# Patient Record
Sex: Female | Born: 1965 | Race: Black or African American | Hispanic: No | Marital: Married | Smoking: Never smoker
Health system: Southern US, Community
[De-identification: ages and names within clinical notes are randomized; demographics above are authoritative.]

## PROBLEM LIST (undated history)

## (undated) DIAGNOSIS — C539 Malignant neoplasm of cervix uteri, unspecified: Secondary | ICD-10-CM

## (undated) DIAGNOSIS — E10319 Type 1 diabetes mellitus with unspecified diabetic retinopathy without macular edema: Secondary | ICD-10-CM

## (undated) DIAGNOSIS — I10 Essential (primary) hypertension: Secondary | ICD-10-CM

## (undated) DIAGNOSIS — Z992 Dependence on renal dialysis: Secondary | ICD-10-CM

## (undated) DIAGNOSIS — N289 Disorder of kidney and ureter, unspecified: Secondary | ICD-10-CM

## (undated) DIAGNOSIS — D649 Anemia, unspecified: Secondary | ICD-10-CM

## (undated) HISTORY — DX: Anemia, unspecified: D64.9

## (undated) HISTORY — PX: TUBAL LIGATION: SHX77

## (undated) HISTORY — PX: ABDOMINAL HYSTERECTOMY: SHX81

## (undated) HISTORY — DX: Type 1 diabetes mellitus with unspecified diabetic retinopathy without macular edema: E10.319

---

## 2005-03-05 ENCOUNTER — Other Ambulatory Visit: Payer: Self-pay

## 2006-03-29 ENCOUNTER — Other Ambulatory Visit: Payer: Self-pay

## 2006-11-28 ENCOUNTER — Other Ambulatory Visit: Payer: Self-pay

## 2007-12-06 ENCOUNTER — Other Ambulatory Visit: Payer: Self-pay

## 2007-12-07 ENCOUNTER — Ambulatory Visit: Payer: Self-pay | Admitting: Cardiology

## 2009-09-29 ENCOUNTER — Other Ambulatory Visit: Payer: Self-pay

## 2011-09-05 ENCOUNTER — Other Ambulatory Visit: Payer: Self-pay | Admitting: Emergency Medicine

## 2012-03-17 ENCOUNTER — Other Ambulatory Visit: Payer: Self-pay | Admitting: Emergency Medicine

## 2012-05-04 ENCOUNTER — Encounter (HOSPITAL_COMMUNITY): Payer: Self-pay | Admitting: Emergency Medicine

## 2012-05-04 ENCOUNTER — Emergency Department (HOSPITAL_COMMUNITY)
Admission: EM | Admit: 2012-05-04 | Discharge: 2012-05-04 | Discharge status: Against Medical Advice | Payer: Self-pay | Attending: Emergency Medicine | Admitting: Emergency Medicine

## 2012-05-04 DIAGNOSIS — E162 Hypoglycemia, unspecified: Secondary | ICD-10-CM | POA: Insufficient documentation

## 2012-05-04 HISTORY — DX: Malignant neoplasm of cervix uteri, unspecified: C53.9

## 2012-05-04 HISTORY — DX: Essential (primary) hypertension: I10

## 2012-05-04 HISTORY — DX: Disorder of kidney and ureter, unspecified: N28.9

## 2012-05-04 HISTORY — DX: Dependence on renal dialysis: Z99.2

## 2012-05-04 LAB — GLUCOSE, CAPILLARY: Glucose-Capillary: 160 mg/dL — ABNORMAL HIGH (ref 70–99)

## 2012-05-04 NOTE — ED Notes (Signed)
Pt and family up to desk to inquire about wait times.  Apologized for delays and encouraged pt to stay.

## 2012-05-04 NOTE — ED Notes (Signed)
Pt reports confusion at home.  States her kids gave her Hawaiian Punch to drink and then she felt better.  Now only c/o bilateral lower extremity edema x 1 week.  States she believes her blood sugar was low. Pt alert and oriented.

## 2012-05-04 NOTE — ED Notes (Signed)
Pt seen leaving ED with family walking with slow steady gait.  Pt in NAD, respirations even and unlabored.

## 2012-09-05 ENCOUNTER — Ambulatory Visit (INDEPENDENT_AMBULATORY_CARE_PROVIDER_SITE_OTHER): Payer: 59 | Admitting: Family Medicine

## 2012-09-05 ENCOUNTER — Encounter: Payer: Self-pay | Admitting: Family Medicine

## 2012-09-05 VITALS — BP 128/72 | HR 88 | Temp 98.1°F | Resp 16 | Ht 60.0 in | Wt 112.4 lb

## 2012-09-05 DIAGNOSIS — K089 Disorder of teeth and supporting structures, unspecified: Secondary | ICD-10-CM

## 2012-09-05 DIAGNOSIS — E109 Type 1 diabetes mellitus without complications: Secondary | ICD-10-CM

## 2012-09-05 DIAGNOSIS — N186 End stage renal disease: Secondary | ICD-10-CM

## 2012-09-05 DIAGNOSIS — E7800 Pure hypercholesterolemia, unspecified: Secondary | ICD-10-CM

## 2012-09-05 DIAGNOSIS — I10 Essential (primary) hypertension: Secondary | ICD-10-CM

## 2012-09-05 LAB — HEMOGLOBIN A1C
Hgb A1c MFr Bld: 8.4 % — ABNORMAL HIGH (ref <5.7)
Mean Plasma Glucose: 194 mg/dL — ABNORMAL HIGH (ref <117)

## 2012-09-05 LAB — COMPREHENSIVE METABOLIC PANEL
ALT: 25 U/L (ref 0–35)
Albumin: 4.3 g/dL (ref 3.5–5.2)
Alkaline Phosphatase: 102 U/L (ref 39–117)
Glucose, Bld: 114 mg/dL — ABNORMAL HIGH (ref 70–99)
Potassium: 4.1 mEq/L (ref 3.5–5.3)
Sodium: 139 mEq/L (ref 135–145)
Total Protein: 7.7 g/dL (ref 6.0–8.3)

## 2012-09-05 LAB — LIPID PANEL
LDL Cholesterol: 156 mg/dL — ABNORMAL HIGH (ref 0–99)
Triglycerides: 84 mg/dL (ref <150)

## 2012-09-05 NOTE — Patient Instructions (Addendum)
1. Pure hypercholesterolemia  CBC with Differential, CK, Comprehensive metabolic panel, Lipid panel  2. Essential hypertension, benign    3. Dental disease    4. End stage renal disease    5. Type I (juvenile type) diabetes mellitus without mention of complication, not stated as uncontrolled

## 2012-09-05 NOTE — Progress Notes (Signed)
[address]   [place], [place]  [address]   [phone]  Subjective:    Patient ID: Susan Briggs, female    DOB: 1965-11-19, 46 y.o.   MRN: [id number]  HPIThis 46 y.o. female presents to establish care and for six month follow-up:  1. ESRD:  Started on HD 05/2012.  Out of work for three months; started full time work in 07/2012.  Attending MWF in [place]; followed by nephrology/[facility].  Sees at [facility].  2.  DMI:  No recent follow-up with Dr. Hyacinth Meeker; insulin stopped in hospital; checking sugars at home; checking five times per week; running 123 before lunch.    3.  Anxiety:  Resolved since switching to HD.  No further issues.  4.  HTN:  Taking Amlodipine, Metoprolol.  No swelling.  Some low BP; drops on HD machine.  No medications on HD days.    5.  Anemia:  Receiving iron infusions at HD.  6.  Hyperlipidemia:  Compliance with Lipitor; ate steak n cheese biscuit this morning.  7.  Dental exam:  Needs clearance for teeth extraction; sent consent form for clearance.  +dental disease.  Extraction of teeth and give partial plate.    8. Flu vaccine in September at [facility] Center.     Review of Systems  Constitutional: Negative for fever, chills, diaphoresis and fatigue.  Eyes: Positive for visual disturbance.  Respiratory: Negative for shortness of breath, wheezing and stridor.   Cardiovascular: Negative for chest pain, palpitations and leg swelling.  Gastrointestinal: Negative for nausea, vomiting, diarrhea and constipation.  Endocrine: Negative for cold intolerance, heat intolerance, polydipsia, polyphagia and polyuria.  Skin: Negative for rash and wound.  Neurological: Negative for dizziness, tremors, syncope, facial asymmetry, speech difficulty, weakness, light-headedness, numbness and headaches.  Psychiatric/Behavioral: Negative for suicidal ideas, sleep disturbance, self-injury, dysphoric mood and decreased concentration. The patient is not nervous/anxious.          Past Medical History  Diagnosis Date  . Diabetes mellitus   . Hypertension   . Renal disorder   . Dialysis patient   . Cervical cancer   . Anemia     Past Surgical History  Procedure Laterality Date  . Abdominal hysterectomy    . Tubal ligation      Prior to Admission medications   Medication Sig Start Date End Date Taking? Authorizing Provider  amLODipine (NORVASC) 5 MG tablet Take 5 mg by mouth daily.   Yes Historical Provider, MD  atorvastatin (LIPITOR) 40 MG tablet Take 40 mg by mouth daily.   Yes Historical Provider, MD  cinacalcet (SENSIPAR) 90 MG tablet Take 90 mg by mouth daily.   Yes Historical Provider, MD  insulin aspart (NOVOLOG) 100 UNIT/ML injection Inject into the skin 3 (three) times daily before meals.   Yes Historical Provider, MD  metoprolol (LOPRESSOR) 100 MG tablet Take 100 mg by mouth 2 (two) times daily.   Yes Historical Provider, MD    Allergies  Allergen Reactions  . Vicodin (Hydrocodone-Acetaminophen)     swelling    History   Social History  . Marital Status: Married    Spouse Name: N/A    Number of Children: N/A  . Years of Education: N/A   Occupational History  . Not on file.   Social History Main Topics  . Smoking status: Never Smoker   . Smokeless tobacco: Not on file  . Alcohol Use: No  . Drug Use: No  . Sexually Active:  Other Topics Concern  . Not on file   Social History Narrative  . No narrative on file    Family History  Problem Relation Age of Onset  . Diabetes Mother   . Kidney disease Mother   . Diabetes Maternal Grandfather   . Kidney disease Maternal Grandfather     Objective:   Physical Exam  Nursing note and vitals reviewed. Constitutional: She is oriented to person, place, and time. She appears well-developed and well-nourished. No distress.  HENT:  Head: Normocephalic and atraumatic.  Mouth/Throat: Oropharynx is clear and moist.  Eyes: Conjunctivae and EOM are normal. Pupils are equal,  round, and reactive to light.  Neck: Normal range of motion. Neck supple. No JVD present. No thyromegaly present.  Cardiovascular: Normal rate and regular rhythm.   Murmur heard. Pulmonary/Chest: Effort normal and breath sounds normal. She has no wheezes. She has no rales.  Abdominal: Soft. Bowel sounds are normal. There is no tenderness. There is no rebound and no guarding.  Lymphadenopathy:    She has no cervical adenopathy.  Neurological: She is alert and oriented to person, place, and time. No cranial nerve deficit. She exhibits normal muscle tone. Coordination normal.  Skin: She is not diaphoretic.  Psychiatric: She has a normal mood and affect. Her behavior is normal. Judgment and thought content normal.       Assessment & Plan:  Pure hypercholesterolemia - Plan: CBC with Differential, CK, Comprehensive metabolic panel, Lipid panel  Essential hypertension, benign  Dental disease  End stage renal disease  Type I (juvenile type) diabetes mellitus without mention of complication, not stated as uncontrolled - Plan: Hemoglobin A1c   1.  Hyperlipidemia:  Uncontrolled; obtain labs; continue current medications. 2.  HTN: improved with initiation of HD.  Clearance for dental procedure. 3.  Dental disease: New.  Clearance for dental procedure. 4.  ESRD: worsening; failed peritoneal dialysis and now symptomatically much improved with HD three days per week.   5.  DM type I: stable; no longer receiving insulin; obtain labs.  No recent follow-up with endocrinology.  Meds ordered this encounter  Medications  . metoprolol (LOPRESSOR) 100 MG tablet    Sig: Take 100 mg by mouth 2 (two) times daily.  Marland Kitchen amLODipine (NORVASC) 5 MG tablet    Sig: Take 5 mg by mouth daily.  Marland Kitchen atorvastatin (LIPITOR) 40 MG tablet    Sig: Take 40 mg by mouth daily.  . cinacalcet (SENSIPAR) 90 MG tablet    Sig: Take 90 mg by mouth daily.  . insulin aspart (NOVOLOG) 100 UNIT/ML injection    Sig: Inject into the  skin 3 (three) times daily before meals.

## 2012-09-06 LAB — CBC WITH DIFFERENTIAL/PLATELET
Basophils Absolute: 0 10*3/uL (ref 0.0–0.1)
HCT: 36.5 % (ref 36.0–46.0)
Hemoglobin: 11.2 g/dL — ABNORMAL LOW (ref 12.0–15.0)
Lymphocytes Relative: 35 % (ref 12–46)
Monocytes Absolute: 0.5 10*3/uL (ref 0.1–1.0)
Monocytes Relative: 7 % (ref 3–12)
Neutro Abs: 3.7 10*3/uL (ref 1.7–7.7)
WBC: 7 10*3/uL (ref 4.0–10.5)

## 2012-09-14 ENCOUNTER — Encounter: Payer: Self-pay | Admitting: Family Medicine

## 2012-11-26 NOTE — Progress Notes (Signed)
Reviewed and agree.

## 2012-12-28 ENCOUNTER — Telehealth: Payer: Self-pay | Admitting: Radiology

## 2012-12-28 NOTE — Telephone Encounter (Signed)
Patient needs hospital follow up [phone] is the contact number given from Dr Katrinka Blazing old office [facility]. I have called patient at the number provided and left message for patient to call me back so I can provide her with Dr Lonn Georgia hours so she can follow up here. To you FYI

## 2013-01-05 ENCOUNTER — Ambulatory Visit (INDEPENDENT_AMBULATORY_CARE_PROVIDER_SITE_OTHER): Payer: 59 | Admitting: Family Medicine

## 2013-01-05 VITALS — BP 160/84 | HR 78 | Temp 98.2°F | Resp 16 | Ht 59.75 in | Wt 110.0 lb

## 2013-01-05 DIAGNOSIS — N186 End stage renal disease: Secondary | ICD-10-CM

## 2013-01-05 DIAGNOSIS — I10 Essential (primary) hypertension: Secondary | ICD-10-CM

## 2013-01-05 DIAGNOSIS — E108 Type 1 diabetes mellitus with unspecified complications: Secondary | ICD-10-CM

## 2013-01-05 DIAGNOSIS — R1012 Left upper quadrant pain: Secondary | ICD-10-CM

## 2013-01-05 NOTE — Patient Instructions (Addendum)
Abdominal pain, left upper quadrant  Essential hypertension, benign    1. INCREASE AMLODIPINE TO 10MG  DAILY FOR BLOOD PRESSURE. 2.  START PANTOPRAZOLE 40MG  ONCE DAILY FOR STOMACH INFLAMMATION; TAKE FOR ONE MONTH ONLY.
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
## 2013-01-06 DIAGNOSIS — N186 End stage renal disease: Secondary | ICD-10-CM | POA: Insufficient documentation

## 2013-01-06 DIAGNOSIS — I10 Essential (primary) hypertension: Secondary | ICD-10-CM | POA: Insufficient documentation

## 2013-01-06 DIAGNOSIS — E1065 Type 1 diabetes mellitus with hyperglycemia: Secondary | ICD-10-CM | POA: Insufficient documentation

## 2013-01-06 DIAGNOSIS — R1012 Left upper quadrant pain: Secondary | ICD-10-CM | POA: Insufficient documentation

## 2013-01-06 MED ORDER — PANTOPRAZOLE SODIUM 40 MG PO TBEC: 40.0000 mg | DELAYED_RELEASE_TABLET | Freq: Every day | ORAL | Status: DC

## 2013-01-06 MED ORDER — AMLODIPINE BESYLATE 10 MG PO TABS: 10.0000 mg | ORAL_TABLET | Freq: Every day | ORAL | Status: DC

## 2013-01-06 NOTE — Assessment & Plan Note (Signed)
Uncontrolled; increase Amlodipine to 10mg  daily.  Do not fill Hydralazine 50mg  and Losartan 50mg .

## 2013-01-06 NOTE — Assessment & Plan Note (Signed)
Stable; tolerating HD with euvolemia.  Nephrologist has not adjusted antihypertensive medications recently so will adjust at this time.

## 2013-01-06 NOTE — Assessment & Plan Note (Signed)
Controlled at this time with PRN insulin.  Stable sugars during recent admission.

## 2013-01-06 NOTE — Assessment & Plan Note (Signed)
New. Admission notes reviewed in detail during visit.  No association to eating.  Now occurring four times daily with each episode lasting one minute.  Recommend starting Protonix 40mg  daily for one month.  If persists or worsens, will warrant GI referral for EGD.

## 2013-03-03 ENCOUNTER — Other Ambulatory Visit: Payer: Self-pay | Admitting: Physician Assistant

## 2013-03-03 ENCOUNTER — Ambulatory Visit (INDEPENDENT_AMBULATORY_CARE_PROVIDER_SITE_OTHER): Payer: 59 | Admitting: Emergency Medicine

## 2013-03-03 DIAGNOSIS — L299 Pruritus, unspecified: Secondary | ICD-10-CM

## 2013-03-03 DIAGNOSIS — R21 Rash and other nonspecific skin eruption: Secondary | ICD-10-CM

## 2013-03-03 MED ORDER — HYDROXYZINE HCL 10 MG PO TABS: 10.0000 mg | ORAL_TABLET | Freq: Two times a day (BID) | ORAL | Status: AC

## 2013-03-03 MED ORDER — CAMPHOR-MENTHOL 0.5-0.5 % EX LOTN: TOPICAL_LOTION | CUTANEOUS | Status: AC | PRN

## 2013-03-03 NOTE — Progress Notes (Signed)
   [address] [place] [address]   Phone [phone]  Subjective:    Patient ID: Susan Briggs, female    DOB: Mar 04, 1966, 47 y.o.   MRN: [id number]  HPI  Itching for 5 months - not getting better.  She has tried multiple OTC treatments for scabies and none are working but she is positive she has scabies.  No one at home is itching.  She has not told her renal Drs that she is itching.  She had dialysis today.  She has a rash on her back mainly but she is itching everywhere - she scalp is terrible and she has been putting on in it but it is not helping.  OTC - green bug, Clorox, tea tree oil - not helping People in her home are now itching.     Review of Systems  Skin: Positive for rash.       Objective:   Physical Exam  Constitutional: She appears well-developed and well-nourished.  Pt scratching the entire time.  HENT:  Head: Normocephalic and atraumatic.  Right Ear: External ear normal.  Eyes: No scleral icterus.  Skin: Skin is warm and dry.  Hyperpigmented area mainly on back - not in a serpentine pattern and not in her web spaces.  Psychiatric: She has a normal mood and affect. Her behavior is normal. Judgment and thought content normal.   Biopsy - consent obtained.  Local anesthesia with 1% lido with epi - 2mm punch for biopsy of a lesion to r/o scabies.       Assessment & Plan:  Itching -most likely related to her renal disease - low chance of scabies due to distribution and no one at home with problems.  Plan: Comprehensive metabolic panel, Dermatology pathology, camphor-menthol (SARNA) lotion, hydrOXYzine (ATARAX/VISTARIL) 10 MG tablet  Rash and nonspecific skin eruption - Plan: Dermatology pathology  Renal disease - on dialysis - D/w Dr Allena Katz plan for itching.  She will f/u with nephrologist on Monday at her next dialysis treatment.  Pt seen and examined with Dr Cleta Alberts.  Benny Lennert PA-C 03/03/2013 7:36 PM

## 2013-03-04 LAB — COMPREHENSIVE METABOLIC PANEL
ALT: 49 U/L — ABNORMAL HIGH (ref 0–35)
AST: 36 U/L (ref 0–37)
Albumin: 4.4 g/dL (ref 3.5–5.2)
Alkaline Phosphatase: 273 U/L — ABNORMAL HIGH (ref 39–117)
Glucose, Bld: 154 mg/dL — ABNORMAL HIGH (ref 70–99)
Potassium: 3.7 mEq/L (ref 3.5–5.3)
Sodium: 137 mEq/L (ref 135–145)
Total Protein: 7.7 g/dL (ref 6.0–8.3)

## 2013-03-05 NOTE — Addendum Note (Signed)
Addended by: Morrell Riddle on: 03/05/2013 11:36 AM   Modules accepted: Orders

## 2013-03-05 NOTE — Addendum Note (Signed)
Addended by: Johnnette Litter on: 03/05/2013 11:46 AM   Modules accepted: Orders

## 2013-03-10 ENCOUNTER — Telehealth: Payer: Self-pay

## 2013-03-10 NOTE — Telephone Encounter (Signed)
I do not see results for this, have you gotten on paper?

## 2013-03-10 NOTE — Telephone Encounter (Signed)
Patient is calling to get results of biopsy. [phone]

## 2013-03-13 LAB — OTHER SOLSTAS TEST

## 2013-03-13 MED ORDER — DOXYCYCLINE HYCLATE 100 MG PO TABS: 100.0000 mg | ORAL_TABLET | Freq: Two times a day (BID) | ORAL | Status: DC

## 2013-03-13 NOTE — Addendum Note (Signed)
Addended by: Morrell Riddle on: 03/13/2013 04:03 PM   Modules accepted: Orders

## 2013-03-13 NOTE — Telephone Encounter (Signed)
Yes I got on paper - she has folliculitis and I have sent a Rx to the pharmacy.  She also has a result note in the lab.

## 2013-03-14 MED ORDER — DOXYCYCLINE HYCLATE 100 MG PO TABS: 100.0000 mg | ORAL_TABLET | Freq: Two times a day (BID) | ORAL | Status: DC

## 2013-03-14 NOTE — Telephone Encounter (Signed)
I can not see this result. Will you help me view this?

## 2013-03-14 NOTE — Telephone Encounter (Signed)
Patient is waiting on call about biopsy results. She also wants to know which pharmacy her RX was sent to. Please call at [phone]

## 2013-03-14 NOTE — Telephone Encounter (Signed)
Called her and she wants this sent in to [facility]. This is done.

## 2013-04-19 ENCOUNTER — Other Ambulatory Visit: Payer: Self-pay | Admitting: Emergency Medicine

## 2013-07-17 ENCOUNTER — Encounter: Payer: Self-pay | Admitting: Pulmonary Disease

## 2013-07-17 ENCOUNTER — Ambulatory Visit (INDEPENDENT_AMBULATORY_CARE_PROVIDER_SITE_OTHER): Payer: 59 | Admitting: Pulmonary Disease

## 2013-07-17 ENCOUNTER — Ambulatory Visit (INDEPENDENT_AMBULATORY_CARE_PROVIDER_SITE_OTHER)
Admission: RE | Admit: 2013-07-17 | Discharge: 2013-07-17 | Disposition: A | Discharge status: Home / Self Care | Payer: 59 | Source: Ambulatory Visit | Attending: Pulmonary Disease | Admitting: Pulmonary Disease

## 2013-07-17 VITALS — BP 170/80 | HR 85 | Ht 59.0 in | Wt 113.0 lb

## 2013-07-17 DIAGNOSIS — R05 Cough: Secondary | ICD-10-CM

## 2013-07-17 NOTE — Assessment & Plan Note (Addendum)
I think that the most likely etiology for Susan Briggs's cough is acid reflux and cyclical cough where ongoing cough causes more laryngeal inflammation and cough.  She does not have clear evidence of obstructive lung disease on her spirometry.  At this point I want to get a chest x-ray and focus our care for her cough on cycylical cough and GERD.  Plan: -start OTC prilosec -GERD lifestyle measures reviewed -tramadol, voice rest, active cough suppression -f/u in 3-4 weeks

## 2013-07-17 NOTE — Progress Notes (Signed)
Subjective:    Patient ID: Susan Briggs, female    DOB: 12/26/1965, 47 y.o.   MRN: [id number]  HPI  This is a very pleasant 47 year old female with end-stage renal disease and diabetes who comes to our clinic today for evaluation of a cough. She's had a cough for 5 months now. She does not believe that it started with a fever or symptoms of an upper respiratory infection. She does not feel cold symptoms in that she does not have much in the way of postnasal drip or sinus congestion. She denies any body aches. She states that the cough was persistent throughout the day. Sometimes it is worse after eating spicy foods. It is associated with a sensation of tickling in her throat. She feels that there is mucus in her throat that she cannot get rid of. She does not have sputum production. She does not feel shortness of breath except when the coughing paroxysms are particularly lengthy. She states that she tried Tussionex for the cough and it helps some but she was unable to take much of it because it made her very sleepy. She does not take anything for acid reflux. She does not feel much in the way of heartburn. She states that she has not had a cold this year and denies any fevers or chills. She has never smoked cigarettes. She moved recently, approximately one month ago, but her symptoms started before this and were not changed by the move. She does not have any animals in the house.   Past Medical History  Diagnosis Date  . Diabetes mellitus   . Hypertension   . Renal disorder   . Dialysis patient   . Cervical cancer   . Anemia      Family History  Problem Relation Age of Onset  . Diabetes Mother   . Kidney disease Mother   . Diabetes Maternal Grandfather   . Kidney disease Maternal Grandfather      History   Social History  . Marital Status: Married    Spouse Name: N/A    Number of Children: N/A  . Years of Education: N/A   Occupational History  . Not on file.   Social History  Main Topics  . Smoking status: Never Smoker   . Smokeless tobacco: Not on file  . Alcohol Use: No  . Drug Use: No  . Sexual Activity:    Other Topics Concern  . Not on file   Social History Narrative  . No narrative on file     Allergies  Allergen Reactions  . Vicodin [Hydrocodone-Acetaminophen]     swelling     Outpatient Prescriptions Prior to Visit  Medication Sig Dispense Refill  . amLODipine (NORVASC) 10 MG tablet Take 10 mg by mouth daily.      Marland Kitchen atorvastatin (LIPITOR) 40 MG tablet Take 40 mg by mouth daily.      . camphor-menthol (SARNA) lotion Apply topically as needed for itching.  222 mL  2  . cinacalcet (SENSIPAR) 90 MG tablet Take 90 mg by mouth daily.      . hydrOXYzine (ATARAX/VISTARIL) 10 MG tablet Take 1 tablet (10 mg total) by mouth 2 (two) times daily.  60 tablet  0  . insulin aspart (NOVOLOG) 100 UNIT/ML injection Inject into the skin 3 (three) times daily before meals.      . metoprolol (LOPRESSOR) 100 MG tablet Take 100 mg by mouth 2 (two) times daily.      Marland Kitchen doxycycline (VIBRA-TABS)  100 MG tablet Take 1 tablet (100 mg total) by mouth 2 (two) times daily.  20 tablet  0   No facility-administered medications prior to visit.      Review of Systems  Constitutional: Negative for fever, chills, diaphoresis, activity change, appetite change, fatigue and unexpected weight change.  HENT: Negative for hearing loss, ear pain, nosebleeds, congestion, sore throat, facial swelling, rhinorrhea, sneezing, mouth sores, trouble swallowing, neck pain, neck stiffness, dental problem, voice change, postnasal drip, sinus pressure, tinnitus and ear discharge.   Eyes: Negative for photophobia, discharge, itching and visual disturbance.  Respiratory: Positive for cough and shortness of breath. Negative for apnea, choking, chest tightness, wheezing and stridor.   Cardiovascular: Negative for chest pain, palpitations and leg swelling.  Gastrointestinal: Negative for nausea,  vomiting, abdominal pain, constipation, blood in stool and abdominal distention.  Genitourinary: Negative for dysuria, urgency, frequency, hematuria, flank pain, decreased urine volume and difficulty urinating.  Musculoskeletal: Negative for myalgias, back pain, joint swelling, arthralgias and gait problem.  Skin: Negative for color change, pallor and rash.  Neurological: Negative for dizziness, tremors, seizures, syncope, speech difficulty, weakness, light-headedness, numbness and headaches.  Hematological: Negative for adenopathy. Does not bruise/bleed easily.  Psychiatric/Behavioral: Negative for confusion, sleep disturbance and agitation. The patient is not nervous/anxious.        Objective:   Physical Exam  Filed Vitals:   07/17/13 1349  BP: 170/80  Pulse: 85  Height: 4\' 11"  (1.499 m)  Weight: 113 lb (51.256 kg)  SpO2: 93%   room air  Gen: well appearing, frequent cough HEENT: NCAT, PERRL, EOMi, OP clear, neck supple without masses PULM: CTA B CV: RRR, systolic murmur, no JVD AB: BS+, soft, nontender, no hsm Ext: warm, no edema, no clubbing, no cyanosis Derm: no rash or skin breakdown Neuro: A&Ox4, CN II-XII intact, strength 5/5 in all 4 extremities       Assessment & Plan:   Cough I think that the most likely etiology for Ms. Rockers's cough is acid reflux and cyclical cough where ongoing cough causes more laryngeal inflammation and cough.  She does not have clear evidence of obstructive lung disease on her spirometry.  At this point I want to get a chest x-ray and focus our care for her cough on cycylical cough and GERD.  Plan: -start OTC prilosec -GERD lifestyle measures reviewed -tramadol, voice rest, active cough suppression -f/u in 3-4 weeks   Updated Medication List Outpatient Encounter Prescriptions as of 07/17/2013  Medication Sig Dispense Refill  . amLODipine (NORVASC) 10 MG tablet Take 10 mg by mouth daily.      Marland Kitchen atorvastatin (LIPITOR) 40 MG tablet  Take 40 mg by mouth daily.      . camphor-menthol (SARNA) lotion Apply topically as needed for itching.  222 mL  2  . cinacalcet (SENSIPAR) 90 MG tablet Take 90 mg by mouth daily.      . hydrOXYzine (ATARAX/VISTARIL) 10 MG tablet Take 1 tablet (10 mg total) by mouth 2 (two) times daily.  60 tablet  0  . insulin aspart (NOVOLOG) 100 UNIT/ML injection Inject into the skin 3 (three) times daily before meals.      . metoprolol (LOPRESSOR) 100 MG tablet Take 100 mg by mouth 2 (two) times daily.      . [DISCONTINUED] doxycycline (VIBRA-TABS) 100 MG tablet Take 1 tablet (100 mg total) by mouth 2 (two) times daily.  20 tablet  0   No facility-administered encounter medications on file as of 07/17/2013.

## 2013-07-17 NOTE — Patient Instructions (Addendum)
We will call you with the results of the Chest X-ray  Use the tramadol 50mg  twice a day as needed to help suppress the cough.  I want you to pick three days when you don't talk, sing, cough, or laugh.  During this time try to suppress your cough every day. Use warm beverages and candy to sooth your throat.  Buy prilosec over the counter and take it every day no matter how you feel.  Avoid chocolate, caffeine, alcohol, mint, and fatty foods.  Follow the sheet we gave you  We will see you back in one month or sooner if needed, in [facility]

## 2013-07-19 NOTE — Progress Notes (Signed)
Quick Note:  lmomtcb for pt ______ 

## 2013-07-31 ENCOUNTER — Telehealth: Payer: Self-pay | Admitting: Pulmonary Disease

## 2013-07-31 MED ORDER — TRAMADOL HCL 50 MG PO TABS: 50.0000 mg | ORAL_TABLET | Freq: Four times a day (QID) | ORAL | Status: AC | PRN

## 2013-07-31 NOTE — Telephone Encounter (Signed)
I spoke with pt. Her tramadol was never called in from 07/17/13 visit. [facility]. i advised pt will call this in for her. [facility] and gave [facility]. Nothing further needed

## 2013-08-08 ENCOUNTER — Telehealth: Payer: Self-pay | Admitting: Pulmonary Disease

## 2013-08-08 NOTE — Telephone Encounter (Signed)
I spoke with pt. She reports she was RX'd tramadol. She was told by the pharmacy this was to treat pain. I advised pt we also use it to treat cough. She wanted to make sure before she took this. Nothing further needed

## 2013-08-15 ENCOUNTER — Ambulatory Visit: Payer: 59 | Admitting: Pulmonary Disease

## 2013-08-15 NOTE — Progress Notes (Signed)
No show

## 2013-08-25 ENCOUNTER — Telehealth: Payer: Self-pay

## 2013-08-25 NOTE — Telephone Encounter (Signed)
Received form for medical necessity of insulin pump and have placed it in Dr Michaelle Copas box for review.

## 2013-08-27 ENCOUNTER — Other Ambulatory Visit: Payer: Self-pay | Admitting: Emergency Medicine

## 2013-08-28 ENCOUNTER — Other Ambulatory Visit: Payer: Self-pay | Admitting: Internal Medicine

## 2013-08-28 DIAGNOSIS — R079 Chest pain, unspecified: Secondary | ICD-10-CM

## 2013-08-28 NOTE — Telephone Encounter (Signed)
I do not manage patient's diabetes mellitus type I. She is followed by Karn Cassis, MD Endocrinology in [place].  Please call patient and clarify that she is still being followed by Dr. Hyacinth Meeker; also at patient's last visit, she was not using insulin pump; is she on insulin pump now?

## 2013-08-28 NOTE — Telephone Encounter (Signed)
LMOM for pt to CB.  

## 2013-08-29 NOTE — Telephone Encounter (Signed)
Pt CB and reported that she is no longer seeing Dr Hyacinth Meeker because she is no longer practicing there in [place]. She stated that her kidney doctor is trying to get her in to see another endo, but pt asked that med [employer] form be sent to Dr Katrinka Blazing. She stated that she used to use an insulin pump but has not been lately. Pt wishes to go back on a pump because her DM was much better controlled on the pump. I advised pt that she should RTC for f/up OV w/Dr Katrinka Blazing to check status of DM and discuss. Pt agreed and transferred to appt center.  I called Susan Briggs back and gave status of form and he will wait for form after appt.

## 2013-08-29 NOTE — Telephone Encounter (Signed)
MARIO FROM [facility] ON THE MEDICAL NECESSITY FORM SENT OVER ON THE 13TH . PLEASE CALL [phone] EXT [phone]

## 2013-08-31 ENCOUNTER — Ambulatory Visit (INDEPENDENT_AMBULATORY_CARE_PROVIDER_SITE_OTHER): Payer: 59 | Admitting: Family Medicine

## 2013-08-31 VITALS — BP 165/70 | HR 73 | Temp 98.1°F | Resp 16 | Ht 60.5 in | Wt 117.0 lb

## 2013-08-31 DIAGNOSIS — I10 Essential (primary) hypertension: Secondary | ICD-10-CM

## 2013-08-31 DIAGNOSIS — E1059 Type 1 diabetes mellitus with other circulatory complications: Secondary | ICD-10-CM

## 2013-08-31 DIAGNOSIS — N186 End stage renal disease: Secondary | ICD-10-CM

## 2013-08-31 LAB — GLUCOSE, POCT (MANUAL RESULT ENTRY): POC Glucose: 163 mg/dl — AB (ref 70–99)

## 2013-08-31 NOTE — Progress Notes (Signed)
[address]   [place], [place]  [address]   [phone] Subjective:    Patient ID: Susan Briggs, female    DOB: [birth year], 47 y.o.   MRN: [id number]  This chart was scribed for Ethelda Chick, MD by Blanchard Kelch, ED Scribe. The patient was seen in room 2. Patient's care was started at 8:13 PM.   HPI  Susan Briggs is a 47 y.o. female who presents to office for a follow up post hospitalization and TRANSITION INTO CARE. She states that five days ago she went into the ER because her BP went up to 270/104 and her blood sugar was 700. [place] the circus the [place] but didn't eat anything but popcorn. She denies doing anything abnormal that could have caused the abnormal readings. She had dialysis a day before going to the ER and her BP went up then to the 200. She had a mild headache that day but denies that she had any abnormal blurred vision, double vision, dizziness, numbness or tingling. They did an EKG and a CT scan while she was in the hospital. They were regularly checking her sugars and giving her units of insulin if it was high. She was kept her there for two days, increased her Losartan from 50 mg to 100 mg and told her to follow up with her PCP.  For her diabetes, she does 15 units before each meal if her glucose levels are over 200. Before the hospitalization she states that they were normal. She last checked it two weeks before going to the ER and it was around 100. Her levels since leaving the hospital have been in the 200s. Her BP since leaving has been around 163/89, which she reports is normal for her. She now checks her BP every day and her blood sugar twice a day. She denies any chest pain or shortness of breath.    She is currently on disability due to her dialysis and ESRD. She is keeping busy by helping out with her daughter's children. They all live with her currently. She does dialysis MWF in [place].   Past Medical History  Diagnosis Date  . Diabetes  mellitus   . Hypertension   . Renal disorder   . Dialysis patient   . Cervical cancer   . Anemia    Past Surgical History  Procedure Laterality Date  . Abdominal hysterectomy    . Tubal ligation     Family History  Problem Relation Age of Onset  . Diabetes Mother   . Kidney disease Mother   . Diabetes Maternal Grandfather   . Kidney disease Maternal Grandfather    Allergies  Allergen Reactions  . Vicodin [Hydrocodone-Acetaminophen]     swelling   Current Outpatient Prescriptions on File Prior to Visit  Medication Sig Dispense Refill  . amLODipine (NORVASC) 10 MG tablet Take 10 mg by mouth daily.      Marland Kitchen atorvastatin (LIPITOR) 40 MG tablet Take 40 mg by mouth daily.      . hydrOXYzine (ATARAX/VISTARIL) 10 MG tablet Take 1 tablet (10 mg total) by mouth 2 (two) times daily.  60 tablet  0  . insulin aspart (NOVOLOG) 100 UNIT/ML injection Inject into the skin 3 (three) times daily before meals.      . camphor-menthol (SARNA) lotion Apply topically as needed for itching.  222 mL  2  . cinacalcet (SENSIPAR) 90 MG tablet Take 90 mg by mouth daily.      Marland Kitchen  metoprolol (LOPRESSOR) 100 MG tablet Take 100 mg by mouth 2 (two) times daily.      . traMADol (ULTRAM) 50 MG tablet Take 1 tablet (50 mg total) by mouth every 6 (six) hours as needed.  40 tablet  0   No current facility-administered medications on file prior to visit.   History   Social History  . Marital Status: Married    Spouse Name: N/A    Number of Children: N/A  . Years of Education: N/A   Occupational History  . Not on file.   Social History Main Topics  . Smoking status: Never Smoker   . Smokeless tobacco: Not on file  . Alcohol Use: No  . Drug Use: No  . Sexual Activity:    Other Topics Concern  . Not on file   Social History Narrative  . No narrative on file     Review of Systems  Constitutional: Negative for fever.  HENT: Negative for drooling.   Eyes: Positive for visual disturbance. Negative for  discharge.  Respiratory: Negative for cough and shortness of breath.   Cardiovascular: Positive for leg swelling. Negative for chest pain and palpitations.  Gastrointestinal: Negative for vomiting and abdominal pain.  Endocrine: Negative for heat intolerance, polydipsia, polyphagia and polyuria.  Genitourinary: Negative for hematuria.  Musculoskeletal: Negative for gait problem.  Skin: Negative for rash.  Allergic/Immunologic: Negative for immunocompromised state.  Neurological: Negative for dizziness, tremors, seizures, syncope, facial asymmetry, speech difficulty, weakness, light-headedness, numbness and headaches.  Hematological: Negative for adenopathy.  Psychiatric/Behavioral: Negative for confusion.       Objective:   Physical Exam  Nursing note and vitals reviewed. Constitutional: She is oriented to person, place, and time. She appears well-developed and well-nourished. No distress.  HENT:  Head: Normocephalic and atraumatic.  Eyes: EOM are normal.  Neck: Neck supple. No tracheal deviation present.  Cardiovascular: Normal rate and regular rhythm.   Murmur heard. Harsh systolic murmur, radiates into bilateral carotids.  Pulmonary/Chest: Effort normal. No respiratory distress. She has no wheezes. She has no rhonchi. She has no rales.  Abdominal: Soft. She exhibits no distension. There is no tenderness.  Musculoskeletal: Normal range of motion. She exhibits edema.  1+ pitting edema   Neurological: She is alert and oriented to person, place, and time.  Skin: Skin is warm and dry.  Psychiatric: She has a normal mood and affect. Her behavior is normal.   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 HTN: uncontrolled and warranted admission for elevated readings; improved BP today; refill of Losartan 100mg  daily provided.  Admission records reviewed in detail during visit; asymptomatic today. 2.  DMI:  Moderately controlled at this time; continue SSI; encourage checking sugars tid.  Encourage follow-up with endocrinology. 3.  ESRD: stable; maintained on HD three days weekly; compliance with HD.  Good family support; now on disability.  Meds ordered this encounter  Medications  . losartan (COZAAR) 100 MG tablet    Sig: Take 100 mg by mouth daily.   I personally performed the services described in this documentation, which was scribed in my presence.  The recorded information has  been reviewed and is accurate.  Nilda Simmer, M.D.  [facility] [address] [place], [place]  [address] [phone] phone [phone] fax

## 2013-08-31 NOTE — Telephone Encounter (Signed)
Evaluated patient in office on 08/31/13; I do not manage insulin pumps; she is awaiting endocrinology appointment; to defer insulin pump medical necessity until endocrinology evaluates her.  Please advise Marquita Palms from [facility].

## 2013-09-01 NOTE — Telephone Encounter (Signed)
Called and [facility] for [facility] w/info from Dr Katrinka Blazing.

## 2013-09-12 ENCOUNTER — Ambulatory Visit: Payer: 59 | Admitting: Pulmonary Disease

## 2013-09-12 NOTE — Progress Notes (Signed)
No show

## 2013-10-19 ENCOUNTER — Telehealth: Payer: Self-pay

## 2013-10-19 NOTE — Telephone Encounter (Signed)
Patient of Dr Tamala Julian. Would like cream for scabies. [facility] [place] Cb# [phone].

## 2013-10-20 NOTE — Telephone Encounter (Signed)
Called her left message for her to call me back.

## 2013-10-20 NOTE — Telephone Encounter (Signed)
Pt returning Amy's call she states that she was diagnosed with scabies at a different urgent care and the cream that they prescribed her is not working for her.She states that she is asking Dr.Smith because she is her primary doctor. Best# [phone]

## 2013-10-20 NOTE — Telephone Encounter (Signed)
Called again, how long has it been since she used the cream? Sometimes it can take a while for the itching to resolve following treatment. She states it has been since August. She indicates she did clean all her bedding as well. Please advise, I have told patient she may need visit. Please advise.

## 2013-10-20 NOTE — Telephone Encounter (Signed)
Call for details; why does patient think she has scabies?

## 2013-10-22 NOTE — Telephone Encounter (Signed)
Spoke with pt advised to RTC to discuss rash, pt understood. Will forward to scheduling.

## 2013-10-22 NOTE — Telephone Encounter (Signed)
Call -- advise patient that I recommend office visit to evaluate the persistent rash; please refer her to scheduling to make appointment with me in the upcoming few weeks.

## 2013-10-23 NOTE — Telephone Encounter (Signed)
Called but no answer will try again later.

## 2013-10-24 ENCOUNTER — Ambulatory Visit: Payer: 59 | Admitting: Family Medicine

## 2013-10-24 ENCOUNTER — Encounter: Payer: Self-pay | Admitting: Family Medicine

## 2013-10-24 ENCOUNTER — Ambulatory Visit: Payer: 59 | Admitting: Pulmonary Disease

## 2013-10-24 VITALS — BP 128/72 | HR 82 | Temp 97.7°F | Resp 16 | Ht 60.0 in | Wt 111.6 lb

## 2013-10-24 DIAGNOSIS — L299 Pruritus, unspecified: Secondary | ICD-10-CM

## 2013-10-24 DIAGNOSIS — B86 Scabies: Secondary | ICD-10-CM

## 2013-10-24 DIAGNOSIS — B80 Enterobiasis: Secondary | ICD-10-CM

## 2013-10-24 DIAGNOSIS — E1065 Type 1 diabetes mellitus with hyperglycemia: Secondary | ICD-10-CM

## 2013-10-24 DIAGNOSIS — IMO0002 Reserved for concepts with insufficient information to code with codable children: Secondary | ICD-10-CM

## 2013-10-24 DIAGNOSIS — E108 Type 1 diabetes mellitus with unspecified complications: Secondary | ICD-10-CM

## 2013-10-24 DIAGNOSIS — N186 End stage renal disease: Secondary | ICD-10-CM

## 2013-10-24 LAB — POCT WET PREP WITH KOH
KOH PREP POC: POSITIVE
RBC WET PREP PER HPF POC: NEGATIVE
Trichomonas, UA: NEGATIVE
YEAST WET PREP PER HPF POC: NEGATIVE

## 2013-10-24 MED ORDER — PREDNISONE 20 MG PO TABS: ORAL_TABLET | ORAL | Status: AC

## 2013-10-24 MED ORDER — ALBENDAZOLE 200 MG PO TABS: 400.0000 mg | ORAL_TABLET | Freq: Once | ORAL | Status: AC

## 2013-10-24 MED ORDER — MEBENDAZOLE 100 MG PO CHEW: 100.0000 mg | CHEWABLE_TABLET | Freq: Two times a day (BID) | ORAL | Status: AC

## 2013-10-24 MED ORDER — MEBENDAZOLE 100 MG PO CHEW: 100.0000 mg | CHEWABLE_TABLET | Freq: Two times a day (BID) | ORAL | Status: DC

## 2013-10-24 MED ORDER — PERMETHRIN 5 % EX CREA: 1.0000 "application " | TOPICAL_CREAM | Freq: Once | CUTANEOUS | Status: AC

## 2013-10-24 NOTE — Progress Notes (Signed)
This chart was scribed for Susan Honour, MD by Susan Briggs, ED Scribe. This patient was seen in room Room/bed  and the patient's care was started at 11:02 AM. Subjective:    Patient ID: Susan Briggs, female    DOB: 1965-11-28, 48 y.o.   MRN: [id number] Chief Complaint  Patient presents with  . Scabies    X 10 months  . Pinworm    X 2 years   HPI Susan Briggs is a 48 y.o. female with DMI, ESRD on HD who presents to the [facility] complaining of ongoing scabies x 10 months and pinworms x 2 months with associated itching throughout her body. Pt is in tears upon interview and states she does not have a life due to her current health conditions. She states she is having bilateral extremity itching. She states she feels she initially began having scabies in her head/hair. Pt received a cream from previous provider at urgent care in [place] and would wash off after 8 hours of being applied. She states she has washed her bed sheets in hot water several times. Pt states she called the Urgent Care provider and was advised to F/U with PCP. Pt states she did not come in to [facility] to be seen sooner because "she feels ashamed of her current condition". She states she washes her hair daily to have relief; she has not seen any lice in her hair. Pt is unsure if she or her daughter sees any small bugs. She lives with her daughter.  Pt states she has pinworms for the past two years. She states she sees a gastroenterologist in [place]. Pt was rx pills that she took 1 and another 2. Pt states she does not see worms in her stool but states she does feel movement of the worms in her extremities; she also feels pinworms in vaginal area; will not undergo gynecological exam for pap smear until pinworms have resolved.   Pt states she is going to dialysis though. She states she is taking her BP medications. Pt states she has not had sick contacts with other individuals whom have scabies.  She has no idea how  she contracted scabies or pinworms.    Denies major stressors and states that she is handling personal stressors well.  With further interview, patient is currently living with daughter and grandchild. Husband has been incarcerated for Medicare fraud; husband was having an affair with his supervisor of a group home.  Patient has lost her insurance; waiting on [employer] to take effect.  Was previously working one year ago at [employer]; loved her job; approved for disability due to CKD requiring HD. Denies depressive symptoms or anxiety symptoms.  Pt called and requested to be seen by Dr. Tamala Julian last week for scabies complaint. Pt was seen and diagnosed with scabies and pinworms by another provider in [place] during summer 2014.  Sugars have been normal; patient has not required insulin in several weeks.  Checks sugars once per week on average.    Active Ambulatory Problems    Diagnosis Date Noted  . Abdominal pain, left upper quadrant 01/06/2013  . Essential hypertension, benign 01/06/2013  . End stage renal disease 01/06/2013  . 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 Cough 07/17/2013   Resolved Ambulatory Problems    Diagnosis Date Noted  . No Resolved Ambulatory Problems   Past Medical History  Diagnosis Date  . Diabetes mellitus   . Hypertension   .  Renal disorder   . Dialysis patient   . Cervical cancer   . Anemia    Allergies  Allergen Reactions  . Vicodin [Hydrocodone-Acetaminophen]     swelling   Current outpatient prescriptions:amLODipine (NORVASC) 10 MG tablet, Take 10 mg by mouth daily., Disp: , Rfl: ;  atorvastatin (LIPITOR) 40 MG tablet, Take 40 mg by mouth daily., Disp: , Rfl: ;  camphor-menthol (SARNA) lotion, Apply topically as needed for itching., Disp: 222 mL, Rfl: 2;  cinacalcet (SENSIPAR) 90 MG tablet, Take 90 mg by mouth daily., Disp: , Rfl:  hydrOXYzine (ATARAX/VISTARIL) 10 MG tablet, Take 1 tablet (10 mg  total) by mouth 2 (two) times daily., Disp: 60 tablet, Rfl: 0;  insulin aspart (NOVOLOG) 100 UNIT/ML injection, Inject into the skin 3 (three) times daily before meals., Disp: , Rfl: ;  losartan (COZAAR) 100 MG tablet, Take 100 mg by mouth daily., Disp: , Rfl: ;  metoprolol (LOPRESSOR) 100 MG tablet, Take 100 mg by mouth 2 (two) times daily., Disp: , Rfl:  traMADol (ULTRAM) 50 MG tablet, Take 1 tablet (50 mg total) by mouth every 6 (six) hours as needed., Disp: 40 tablet, Rfl: 0  History   Social History  . Marital Status: Married    Spouse Name: N/A    Number of Children: N/A  . Years of Education: N/A   Occupational History  . Not on file.   Social History Main Topics  . Smoking status: Never Smoker   . Smokeless tobacco: Not on file  . Alcohol Use: No  . Drug Use: No  . Sexual Activity:    Other Topics Concern  . Not on file   Social History Narrative  . No narrative on file   Review of Systems  Constitutional: Negative for fever, chills, diaphoresis and fatigue.  Genitourinary: Negative for frequency, vaginal bleeding, vaginal discharge and vaginal pain.  Skin: Positive for rash and wound. Negative for color change.  Psychiatric/Behavioral: Negative for behavioral problems, confusion, dysphoric mood and agitation. The patient is not nervous/anxious.     Objective:   Physical Exam  Constitutional: She is oriented to person, place, and time. She appears well-developed and well-nourished. She appears distressed.  Tearful; dressed in pajamas and hair wrapped in towel.  HENT:  Head: Normocephalic and atraumatic.  Eyes: Conjunctivae are normal. Pupils are equal, round, and reactive to light.  Neck: Normal range of motion. Neck supple.  Abdominal: Soft. Bowel sounds are normal. She exhibits no distension. There is no tenderness. There is no rebound and no guarding.  Genitourinary: Vagina normal. There is no rash, tenderness, lesion or injury on the right labia. There is no  rash, tenderness, lesion or injury on the left labia. No erythema, tenderness or bleeding around the vagina. No foreign body around the vagina. No vaginal discharge found.  Normal, scant discharge.  Two discrete white 70mm sized debris external genitalia; etiology unclear.   Rectum:  8-10 scattered white 1 mm sized debris not consistent with fecal matter yet etiology unclear.  Neurological: She is alert and oriented to person, place, and time.  Skin: Rash noted. She is not diaphoretic.  1 healing papular lesion R lateral leg. Papular prominence L lower extremity. Macular papular lesions on R hip scattered. Papular lesion in linear distribution to L hip and L buttocks. Scattered macular papular lesions on R buttocks, greater that L buttocks. Scattered macular papular lesions with excoriation on back/torso.Scant distribution of macular papular on abdomen. Healing eschar L ear. Healing papular lesions  posterior neck.  Psychiatric: She has a normal mood and affect. Her behavior is normal. Judgment and thought content normal.  Tearful.    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  Scabies:  Persistent; linear distribution of rash on L hip and buttocks; treat with Elimite 5% cream; may repeat in two weeks.  Rx for Prednisone due to severity of itching.  Also recommend Zyrtec or Claritin every morning and Benadryl 25mg  one tablet at bedtime on days that she does not have HD; she takes Benadryl  before HD.  Will refer to dermatology in upcoming two to four weeks if itching does not resolve to rule out secondary causes of itching. 2.  Pinworms:  Persistent; s/p GI consultation in past; obtain records.  Pt requesting repeat treatment; rx for Vermox provided. 3.  DMI: stable/controlled; check sugars daily while taking Prednisone; will likely warrant insulin for the next week. 4.  ESRD: stable; compliant with HD.    Meds ordered this encounter  Medications  . permethrin (ELIMITE) 5 % cream    Sig: Apply 1 application topically once. May repeat in two weeks    Dispense:  60 g    Refill:  1  . predniSONE (DELTASONE) 20 MG tablet    Sig: Two tablets daily x 3 days then one tablet daily x 5 days    Dispense:  11 tablet    Refill:  0  . DISCONTD: mebendazole (VERMOX) 100 MG chewable tablet    Sig: Chew 1 tablet (100 mg total) by mouth 2 (two) times daily.    Dispense:  6 tablet    Refill:  0  . mebendazole (VERMOX) 100 MG chewable tablet    Sig: Chew 1 tablet (100 mg total) by mouth 2 (two) times daily.    Dispense:  6 tablet    Refill:  0  . albendazole (ALBENZA) 200 MG tablet    Sig: Take 2 tablets (400 mg total) by mouth once. Repeat 2 weeks    Dispense:  2 tablet    Refill:  0    Order Specific Question:  Supervising Provider    Answer:  DOOLITTLE, ROBERT P [[id number]]   I personally performed the services described in this documentation, which was scribed in my presence.  The recorded information has been reviewed and is accurate.  Reginia Forts, M.D.  [facility] [address] [place], [place]  [address] [phone] phone [phone] fax

## 2013-10-24 NOTE — Patient Instructions (Signed)
1. Recommend taking Zyrtec 5mg  one tablet daily for itching. 2. Also recommend Benadryl 25mg  one tablet daily at bedtime when you do not go to dialysis.

## 2013-12-04 ENCOUNTER — Other Ambulatory Visit: Payer: Self-pay

## 2013-12-14 ENCOUNTER — Ambulatory Visit: Payer: Self-pay | Admitting: Family Medicine

## 2013-12-14 DIAGNOSIS — Z0271 Encounter for disability determination: Secondary | ICD-10-CM

## 2013-12-18 ENCOUNTER — Telehealth: Payer: Self-pay

## 2013-12-18 NOTE — Telephone Encounter (Signed)
Patient of Dr Tamala Julian. Says she's gotten her medicaid approved and needs some info from Dr Tamala Julian. Says medicaid will be her secondary insurance. Cb# [phone]

## 2013-12-18 NOTE — Telephone Encounter (Signed)
She needed Dr. Tamala Julian "[place] Access ID #". Advised pt that we do not accept Medicaid. Pt is finding a provider that accepts Medicaid so she does not have to pay co pays.

## 2013-12-22 ENCOUNTER — Other Ambulatory Visit: Payer: Self-pay

## 2014-01-08 ENCOUNTER — Other Ambulatory Visit: Payer: Self-pay | Admitting: Emergency Medicine

## 2014-01-09 ENCOUNTER — Ambulatory Visit: Payer: Self-pay | Admitting: Family Medicine

## 2014-01-17 IMAGING — CR DG ABDOMEN 2V
1 series · 3 of 3 positions shown · non-contrast
Comparison: none

REASON FOR EXAM: abd pain
COMMENTS:

PROCEDURE:     DXR - DXR ABDOMEN 2 V FLAT AND ERECT  - December 24, 2012  [DATE]
RESULT:     Comparison: CT of the abdomen and pelvis 12/22/2012

[Series 1: w abdomen upright · 0.14mm/px · 3 of 3 slices shown]
[im 1/3]
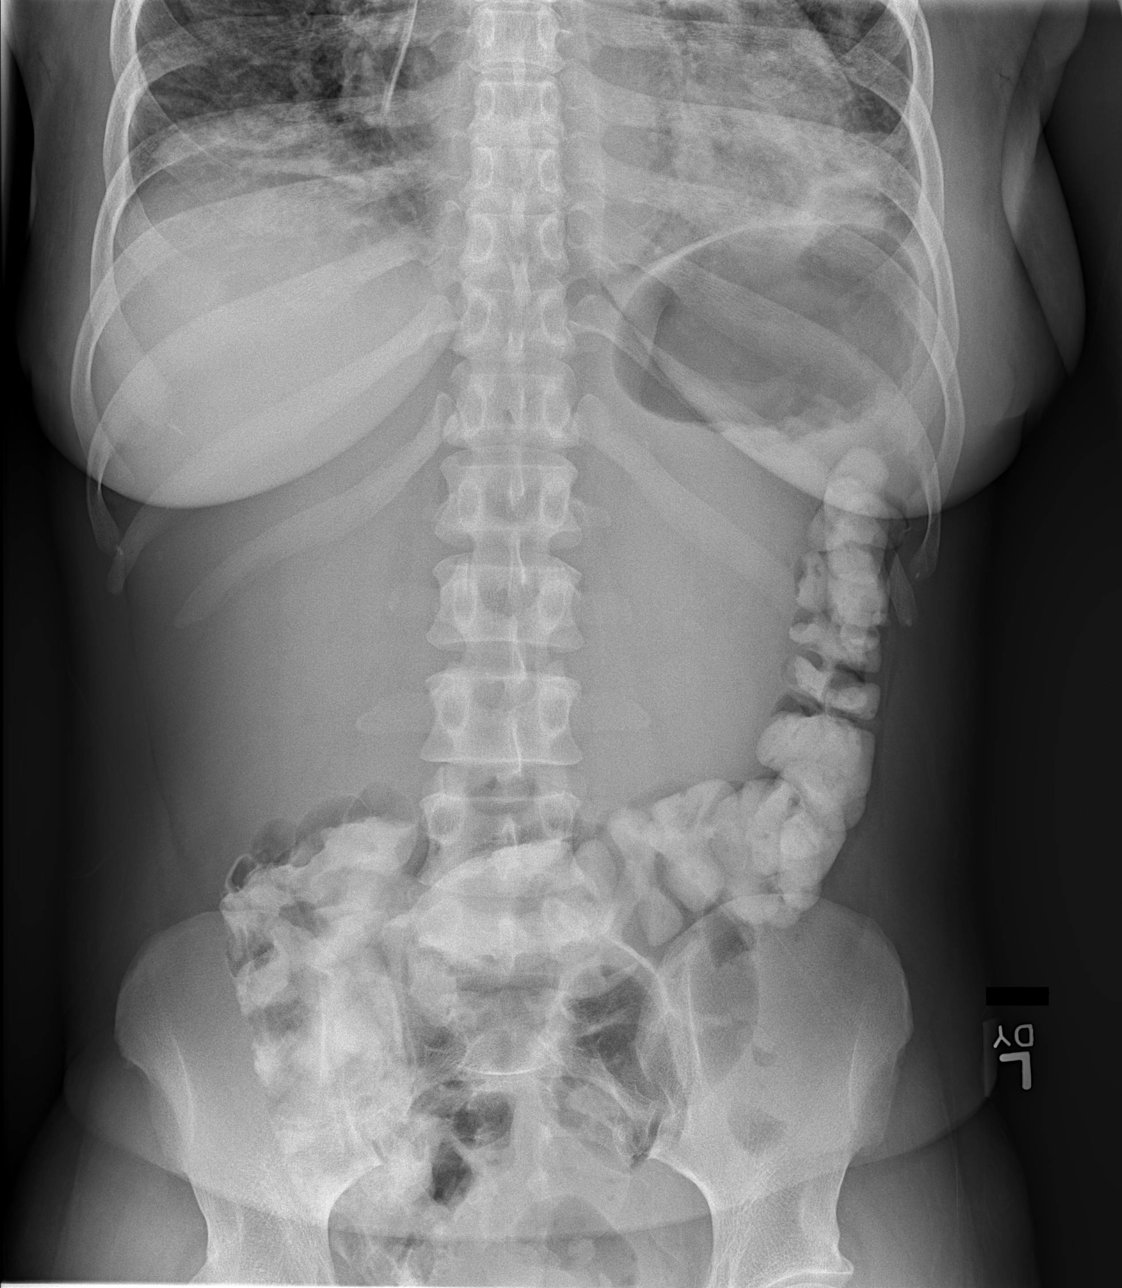
[im 2/3]
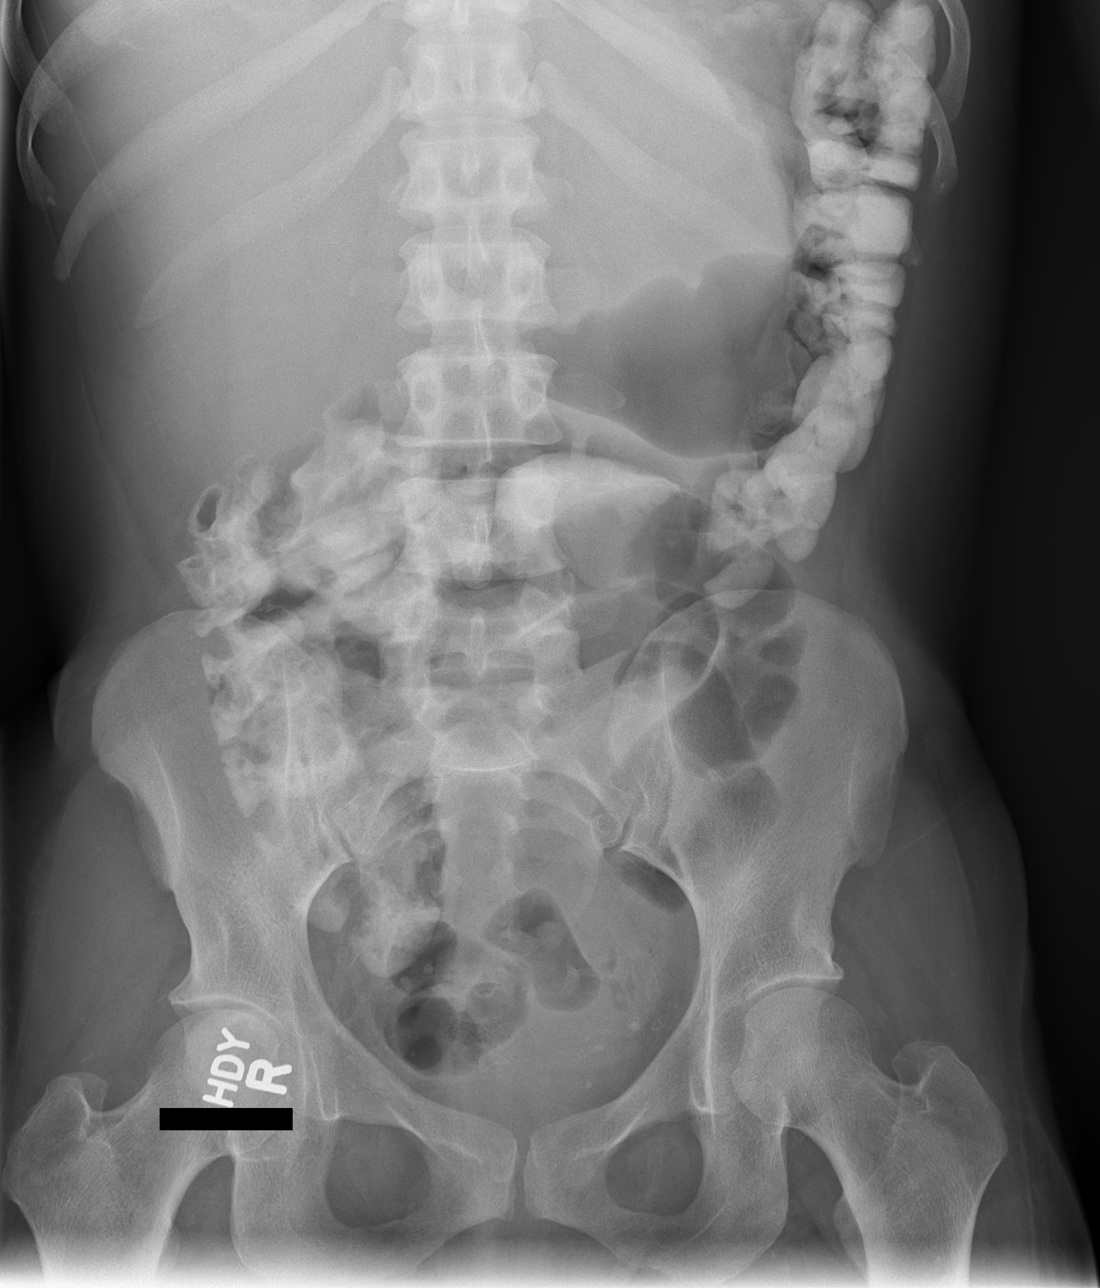
[im 3/3]
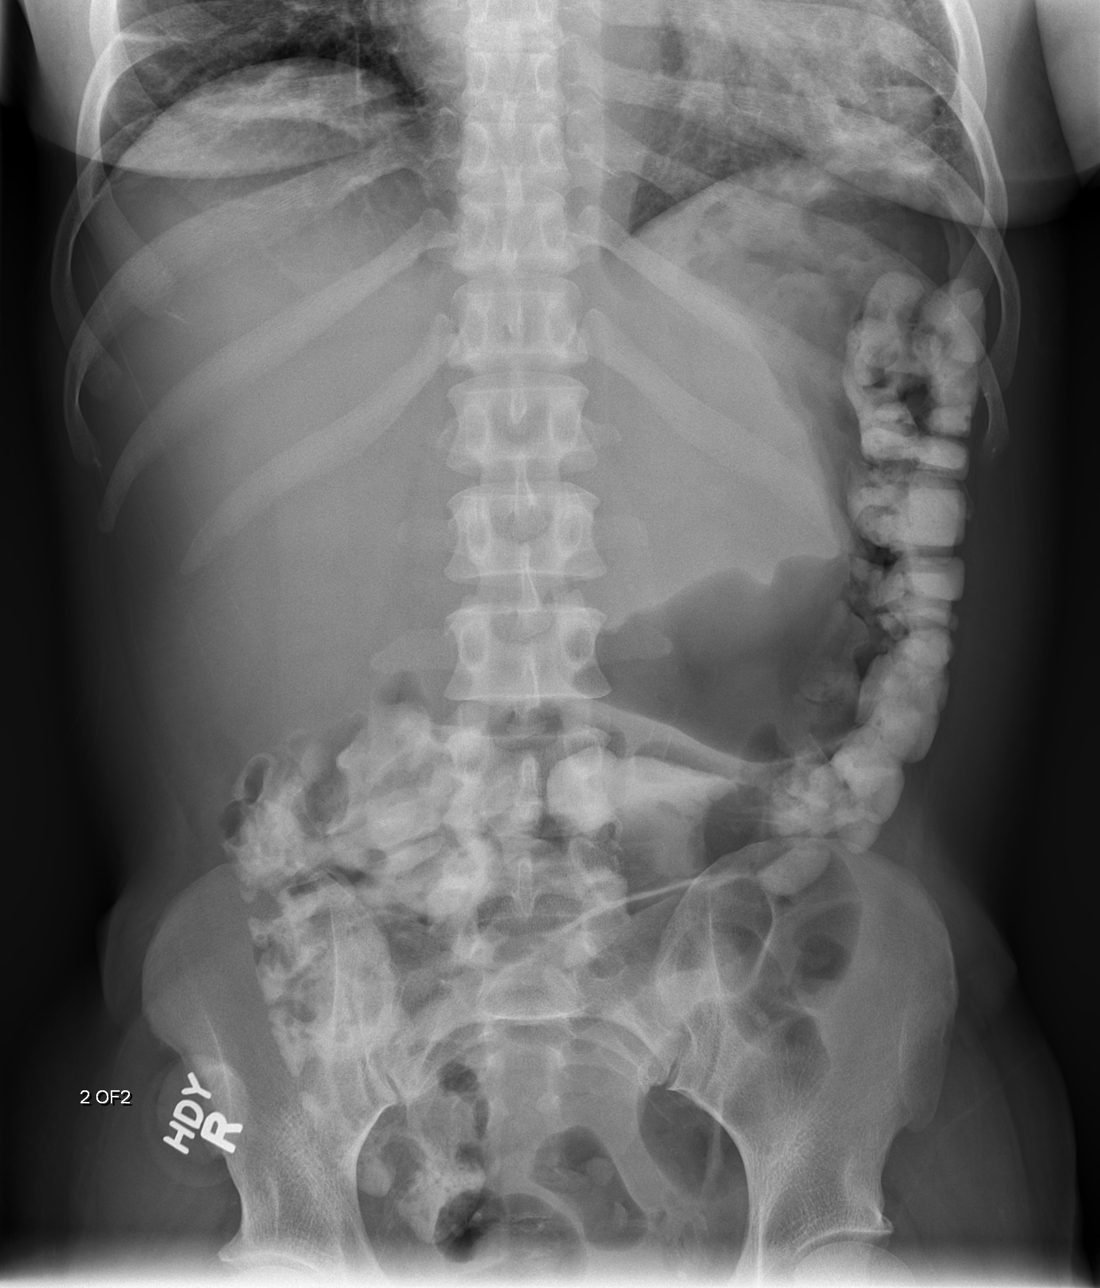

[3 of 3 positions shown; findings below may reference images not displayed]

FINDINGS: Oral contrast material is now seen within nondilated transverse and
ascending colon. There are several loops of air-filled bowel which likely
represent small bowel loops at the upper limits of normal to mildly
distended.

Mild basilar opacities are likely secondary to atelectasis. Infection or
aspiration are not excluded. Central venous catheter tip terminates over the
right atrium.
IMPRESSION: There are findings which likely represent several loops of small bowel at
the upper limits of normal to mildly dilated. This may be secondary to
ileus. Early or partial small bowel obstruction is not excluded.

## 2014-01-18 IMAGING — CT CT ABD-PELV W/ CM
1 of 2 series · 14 of 32 positions shown, 18 images · IV contrast (isovue)
Comparison: none

REASON FOR EXAM: (1) abdominal pain, nausea/vomiting; (2) abdominal pain,
nausea/vomiting *** wit
COMMENTS:   LMP: Post Hysterectomy

PROCEDURE:     CT  - CT ABDOMEN / PELVIS  W  - December 25, 2012  [DATE]
RESULT:     Comparison:  12/22/2012
TECHNIQUE: Multiple axial images of the abdomen and pelvis were performed
from the lung bases to the pubic symphysis, with p.o. contrast and with 80
mL of Isovue 300 intravenous contrast.

[Series 2: 3mm soft tissue · axial · 0.58mm/px · z∈[-982,-574]mm · 14 of 150 slices shown, 18 images]
[im 7/150  soft-tissue]
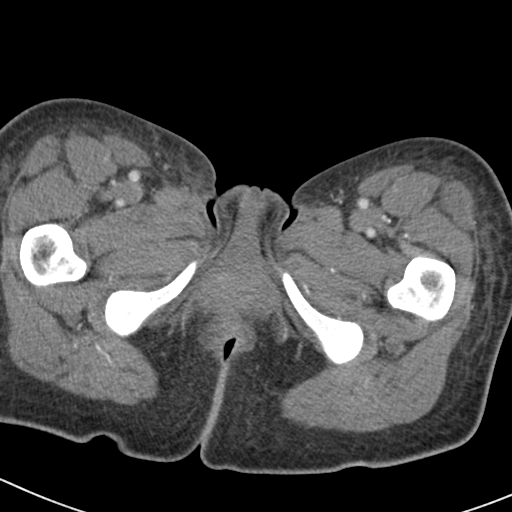
[im 7/150  bone]
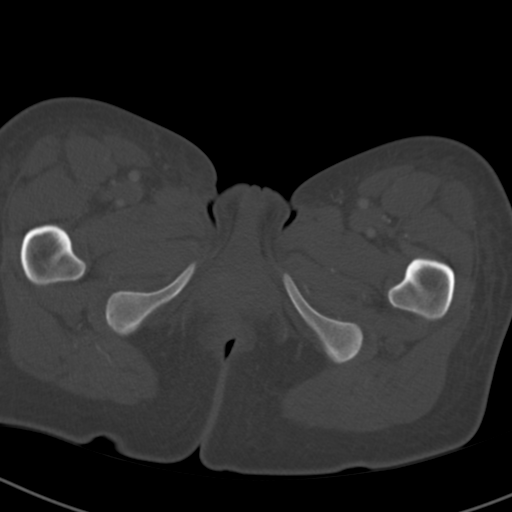
[im 19/150  soft-tissue]
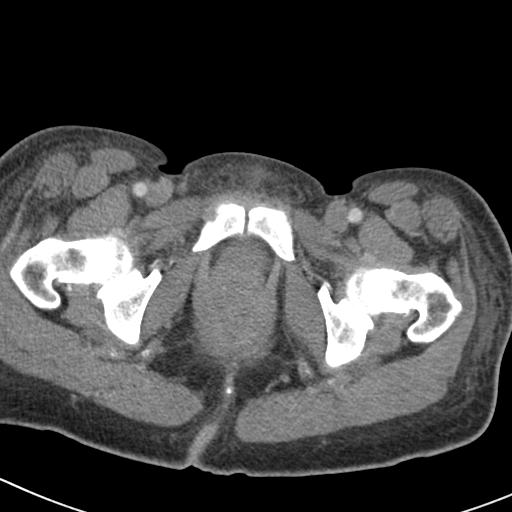
[im 32/150  soft-tissue]
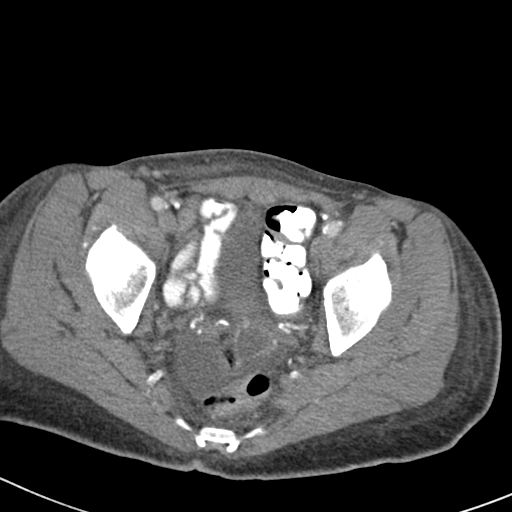
[im 44/150  soft-tissue]
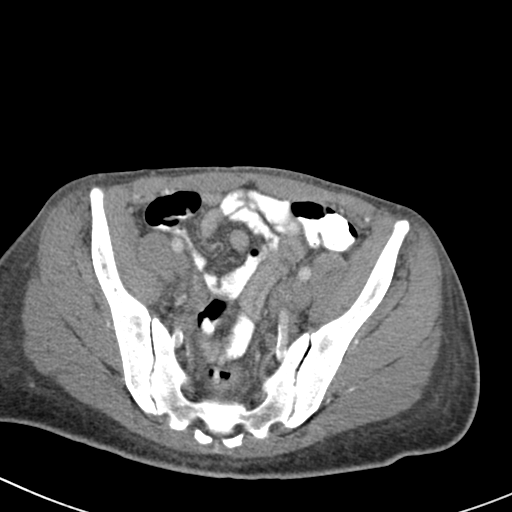
[im 56/150  soft-tissue]
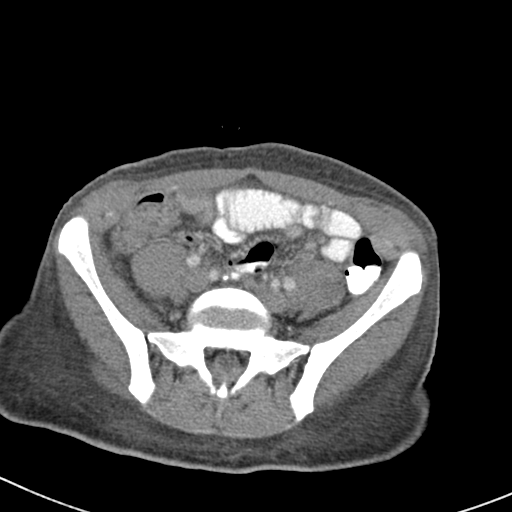
[im 69/150  soft-tissue]
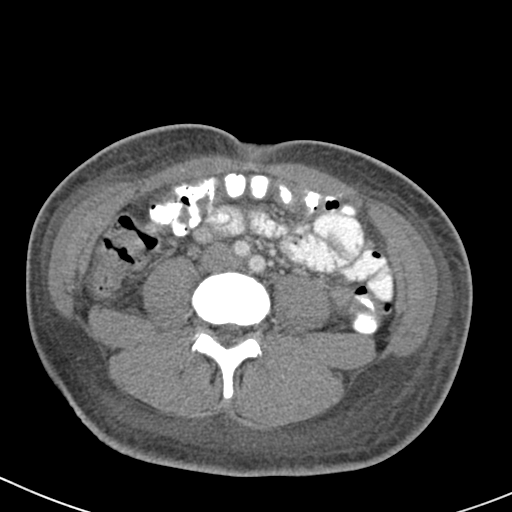
[im 81/150  soft-tissue]
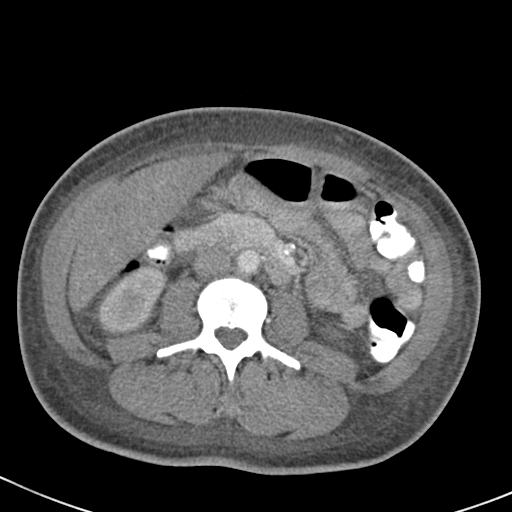
[im 94/150  soft-tissue]
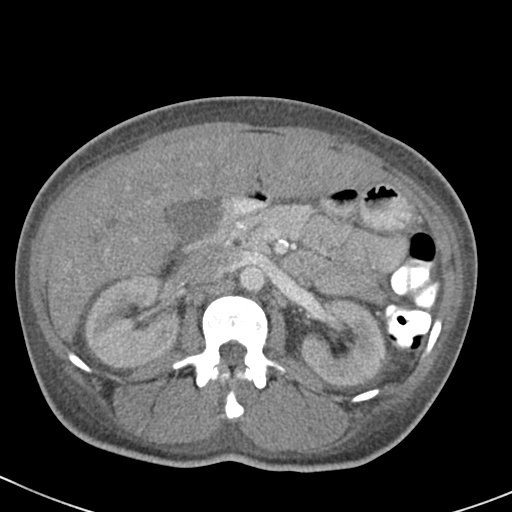
[im 106/150  soft-tissue]
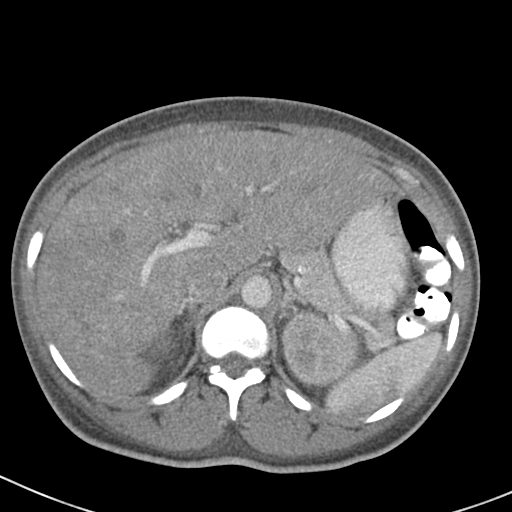
[im 106/150  bone]
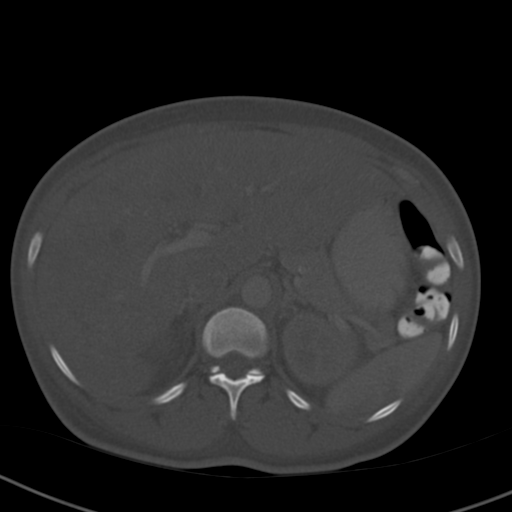
[im 118/150  soft-tissue]
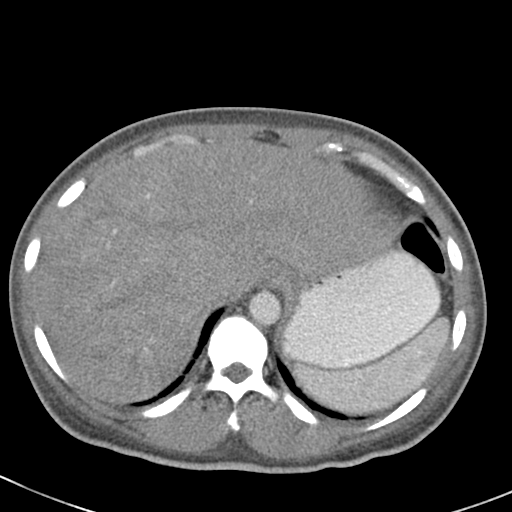
[im 125/150  lung]
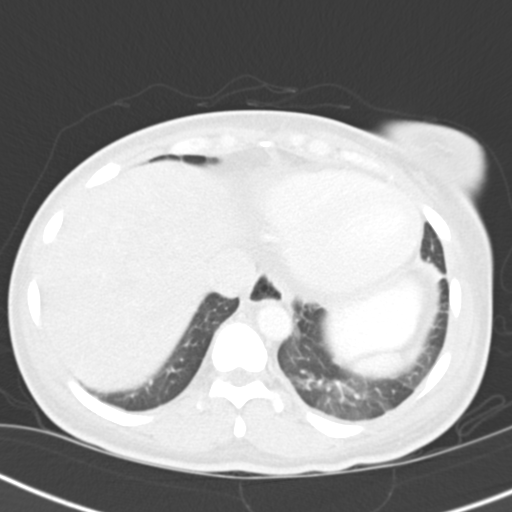
[im 131/150  soft-tissue]
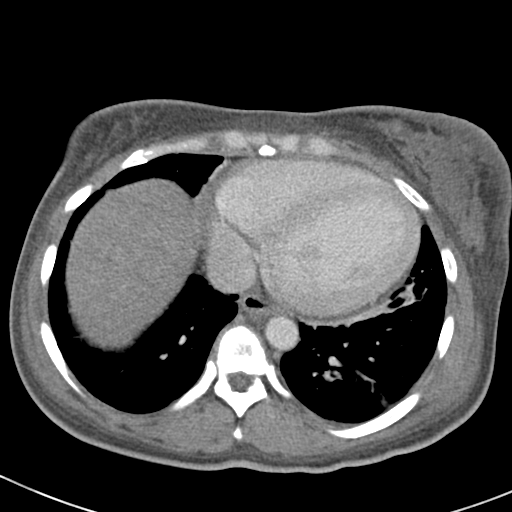
[im 131/150  lung]
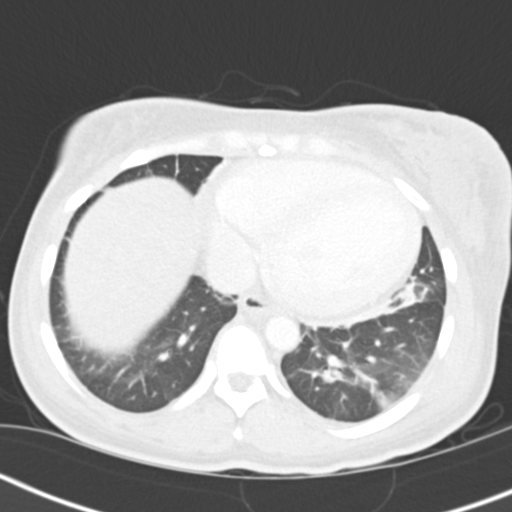
[im 137/150  lung]
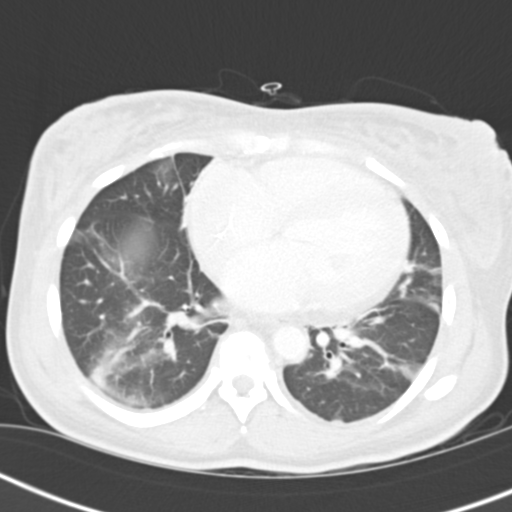
[im 143/150  soft-tissue]
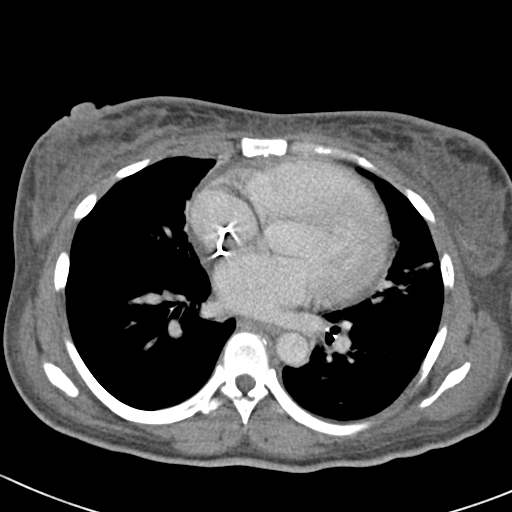
[im 143/150  lung]
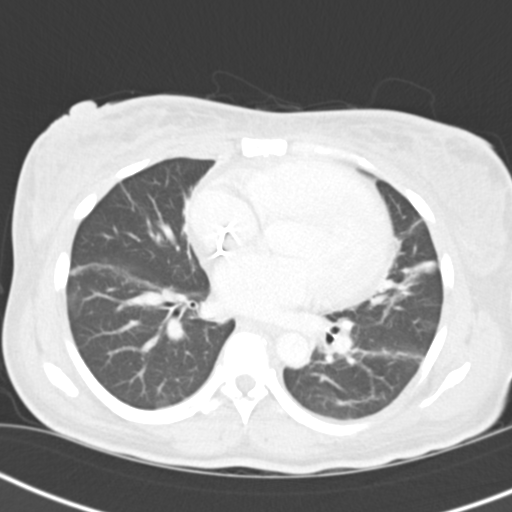

[14 of 32 positions shown; findings below may reference images not displayed]

FINDINGS: Mild basilar opacities are likely secondary to atelectasis. Central venous
catheter terminates in the right atrium.

The liver demonstrates relatively heterogeneous enhancement. Correlate with
laboratory values. This may in part related to the relative phase of
enhancement. Heterogeneous enhancement within the periphery and inferior
aspect of the spleen could be related to the phase enhancement. Multiple
splenic infarcts are of consideration. The pancreas and adrenal glands are
unremarkable. Small low-attenuation lesion in the left kidney is too small
to characterize. The gallbladder is unremarkable.

There is diffuse anasarca. There is a small amount of free fluid in the
pelvis. The patient appears to be status post hysterectomy. Small peripheral
enhancing low-attenuation focus in the anterior aspect of the left
hemipelvis may represent a cyst within the left ovary. Evaluation is
difficult secondary to the diffuse anasarca. This low-attenuation region
measures approximately 2.5 cm in diameter. The appendix is normal. There is
a circumferential bladder wall thickening. This may in part be related to
underdistention.

No aggressive lytic or sclerotic osseous lesions are identified.
IMPRESSION: 1. Small peripheral enhancing low-attenuation focus in the left hemipelvis
could represent an ovarian cyst. However, it is somewhat more inferiorly
positioned than would be expected for the ovary and evaluation is difficult
and limited secondary to the diffuse anasarca. Followup pelvic ultrasound is
recommended to ensure resolution.
2. Relative heterogeneous enhancement of the liver may be related to the
phase of contrast. However, correlate with laboratory values.
3. Heterogeneous enhancement of the periphery of the spleen may be related
to relative phase of contrast enhancement. However, multiple splenic
infarcts are also be of differential consideration. Additionally, correlate
for a recent history of splenic trauma.
4. Circumferential thickening of the bladder wall is nonspecific. Correlate
for cystitis.

[REDACTED]

## 2014-02-07 ENCOUNTER — Other Ambulatory Visit: Payer: Self-pay

## 2014-03-13 ENCOUNTER — Encounter: Payer: Self-pay | Admitting: Family Medicine

## 2014-04-25 ENCOUNTER — Other Ambulatory Visit: Payer: Self-pay | Admitting: Internal Medicine

## 2014-05-20 ENCOUNTER — Other Ambulatory Visit: Payer: Self-pay | Admitting: Emergency Medicine

## 2014-07-16 ENCOUNTER — Telehealth: Payer: Self-pay | Admitting: *Deleted

## 2014-07-16 NOTE — Telephone Encounter (Signed)
Called left voice mail message to see provider to have diabetes checked. Per telephone message 12/19/13 patient has medicaid now and [facility] does not accept this insurance. Patient is looking for a provider who accepts it. She is a patient of Dr Tamala Julian.

## 2014-09-11 DEATH — deceased

## 2014-09-13 ENCOUNTER — Telehealth: Payer: Self-pay | Admitting: Radiology

## 2014-09-13 NOTE — Telephone Encounter (Signed)
Mr Susan Briggs from funeral home was calling regarding the status of [name] death certificate.  Please call him back with status at [phone].

## 2014-09-18 NOTE — Telephone Encounter (Signed)
Please fax a ROI sheet to [place] requesting recent ED records.  Pt is deceased, thus, a signed release is not possible.  Fax number to [facility] medical records:  [phone].

## 2014-09-18 NOTE — Telephone Encounter (Signed)
Form faxed to [facility].

## 2014-09-19 NOTE — Telephone Encounter (Signed)
Per Remi Deter, records placed in Dr. Tamala Julian box.

## 2014-09-19 NOTE — Telephone Encounter (Signed)
Records received. Will forward to Remi Deter or Dr. Tamala Julian.

## 2014-09-25 NOTE — Telephone Encounter (Signed)
Death certificate will be available to pick up Wednesday, 09/26/14 am.  Please advise [place] at [phone].

## 2014-09-25 NOTE — Telephone Encounter (Signed)
Called and advised [place] that her death certificate will be available for pick up tomorrow.

## 2014-09-25 NOTE — Telephone Encounter (Signed)
[place] is calling to check the status of a death certificate. Where are we with this?   [phone]

## 2014-11-22 ENCOUNTER — Telehealth: Payer: Self-pay

## 2014-11-22 NOTE — Telephone Encounter (Signed)
Left message on [employer].

## 2014-11-22 NOTE — Telephone Encounter (Signed)
Susan Briggs from [employer] [employer] in [place] wants Dr. Tamala Julian to call her concerning the pt's death certificate she signed. Please call [phone]

## 2015-01-01 ENCOUNTER — Telehealth: Payer: Self-pay

## 2015-01-01 NOTE — Telephone Encounter (Signed)
Needs box 25 manner of death not checked; needs letter stating that box 25 omitted.  Fax number:  [phone].

## 2015-01-01 NOTE — Telephone Encounter (Signed)
Needs a letter explaining the manner of death for pt. Please call  Gildardo Pounds [phone]

## 2015-01-08 NOTE — Telephone Encounter (Signed)
Faxed letter to [phone].
